# Patient Record
Sex: Male | Born: 1965 | Race: White | Hispanic: No | Marital: Married | Smoking: Never smoker
Health system: Southern US, Community
[De-identification: ages and names within clinical notes are randomized; demographics above are authoritative.]

---

## 2010-01-24 ENCOUNTER — Ambulatory Visit: Payer: Self-pay | Admitting: Diagnostic Radiology

## 2010-01-24 ENCOUNTER — Emergency Department (HOSPITAL_BASED_OUTPATIENT_CLINIC_OR_DEPARTMENT_OTHER): Admission: EM | Admit: 2010-01-24 | Discharge: 2010-01-24 | Payer: Self-pay | Admitting: Emergency Medicine

## 2011-03-08 LAB — DIFFERENTIAL
Basophils Absolute: 0 10*3/uL (ref 0.0–0.1)
Eosinophils Relative: 0 % (ref 0–5)
Lymphocytes Relative: 22 % (ref 12–46)
Monocytes Relative: 7 % (ref 3–12)
Neutro Abs: 5.3 10*3/uL (ref 1.7–7.7)
Neutrophils Relative %: 71 % (ref 43–77)

## 2011-03-08 LAB — BASIC METABOLIC PANEL
BUN: 13 mg/dL (ref 6–23)
CO2: 28 mEq/L (ref 19–32)
Calcium: 9.2 mg/dL (ref 8.4–10.5)
Creatinine, Ser: 1 mg/dL (ref 0.4–1.5)
GFR calc Af Amer: >60 mL/min (ref >60)
GFR calc non Af Amer: >60 mL/min (ref >60)
Glucose, Bld: 98 mg/dL (ref 70–99)

## 2011-03-08 LAB — CBC
Platelets: 275 10*3/uL (ref 150–400)
RBC: 5.99 MIL/uL — ABNORMAL HIGH (ref 4.22–5.81)
WBC: 7.4 10*3/uL (ref 4.0–10.5)

## 2012-08-25 ENCOUNTER — Ambulatory Visit (HOSPITAL_BASED_OUTPATIENT_CLINIC_OR_DEPARTMENT_OTHER)
Admission: RE | Admit: 2012-08-25 | Discharge: 2012-08-25 | Disposition: A | Discharge status: Home / Self Care | Payer: Self-pay | Source: Ambulatory Visit | Attending: Occupational Medicine | Admitting: Occupational Medicine

## 2012-08-25 ENCOUNTER — Other Ambulatory Visit: Payer: Self-pay | Admitting: Occupational Medicine

## 2012-08-25 DIAGNOSIS — Z0000 Encounter for general adult medical examination without abnormal findings: Secondary | ICD-10-CM

## 2012-10-07 IMAGING — CR DG CHEST 1V
1 series · 1 of 1 positions shown · non-contrast
Comparison: None.

CLINICAL DATA: Annual physical.

CHEST - 1 VIEW

[w chest pa]
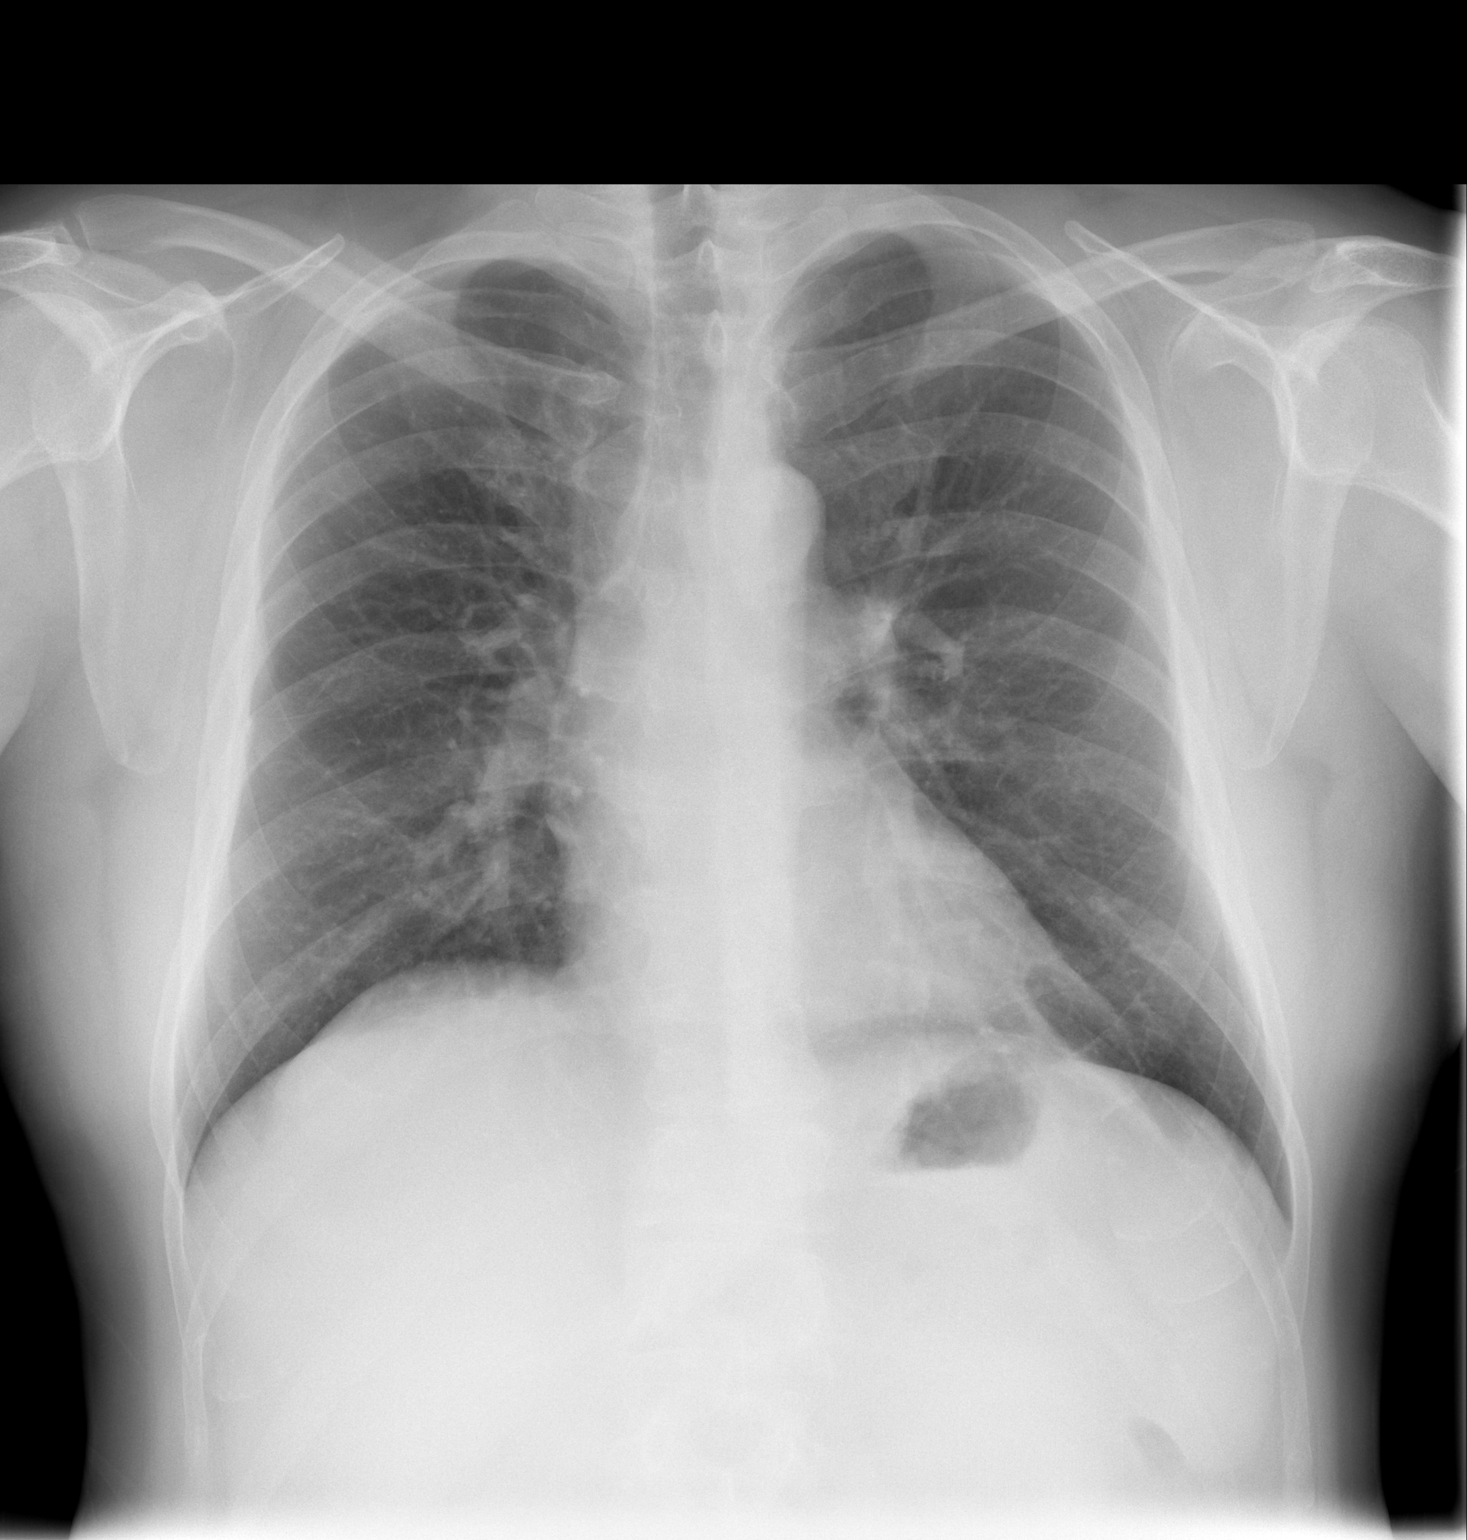

[1 of 1 positions shown; findings below may reference images not displayed]

FINDINGS: The heart size and mediastinal contours are within normal
limits.  Both lungs are clear.
IMPRESSION: No active disease.

## 2023-07-09 ENCOUNTER — Ambulatory Visit
Admission: RE | Admit: 2023-07-09 | Discharge: 2023-07-09 | Disposition: A | Discharge status: Home / Self Care | Payer: Managed Care, Other (non HMO) | Source: Ambulatory Visit | Attending: Physician Assistant | Admitting: Physician Assistant

## 2023-07-09 VITALS — BP 133/86 | HR 101 | Temp 98.9°F | Resp 16

## 2023-07-09 DIAGNOSIS — R6889 Other general symptoms and signs: Secondary | ICD-10-CM

## 2023-07-09 DIAGNOSIS — J029 Acute pharyngitis, unspecified: Secondary | ICD-10-CM

## 2023-07-09 LAB — POCT RAPID STREP A (OFFICE): Rapid Strep A Screen: NEGATIVE

## 2023-07-09 NOTE — Discharge Instructions (Signed)
Warm salt water gargles, Lozenges,  Try using Sudafed and Flonase  for relief of symptoms

## 2023-07-09 NOTE — ED Provider Notes (Signed)
[facility]    CSN: [id number] Arrival date & time: 07/09/23  3244      History   Chief Complaint Chief Complaint  Patient presents with   Sore Throat    Entered by patient    HPI Douglas Howard is a 57 y.o. male.   Patient complains of a sore throat for the past week.  Patient reports symptoms started last Wednesday.  Patient reports his throat has been painful patient reports he has had a lot of congestion and drainage.  Patient denies any fever or chills.  He reports his spouse has also been sick.  Patient has not taken a COVID test.  Patient is worried that he could have strep throat.  Patient also reports he feels like he is losing his voice  The history is provided by the patient. No language interpreter was used.  Sore Throat This is a new problem. The problem occurs constantly. The problem has been gradually worsening. Pertinent negatives include no chest pain, no abdominal pain and no shortness of breath. Nothing aggravates the symptoms. Nothing relieves the symptoms. He has tried nothing for the symptoms.    History reviewed. No pertinent past medical history.  There are no problems to display for this patient.   History reviewed. No pertinent surgical history.     Home Medications    Prior to Admission medications   Not on File    Family History History reviewed. No pertinent family history.  Social History Social History   Tobacco Use   Smoking status: Never   Smokeless tobacco: Never  Substance Use Topics   Alcohol use: Not Currently   Drug use: Never     Allergies   Patient has no known allergies.   Review of Systems Review of Systems  Respiratory:  Negative for shortness of breath.   Cardiovascular:  Negative for chest pain.  Gastrointestinal:  Negative for abdominal pain.  All other systems reviewed and are negative.    Physical Exam Triage Vital Signs ED Triage Vitals  Encounter Vitals Group     BP 07/09/23 0838  133/86     Systolic BP Percentile --      Diastolic BP Percentile --      Pulse Rate 07/09/23 0838 (!) 101     Resp 07/09/23 0838 16     Temp 07/09/23 0838 98.9 F (37.2 C)     Temp Source 07/09/23 0838 Oral     SpO2 07/09/23 0838 95 %     Weight --      Height --      Head Circumference --      Peak Flow --      Pain Score 07/09/23 0837 6     Pain Loc --      Pain Education --      Exclude from Growth Chart --    No data found.  Updated Vital Signs BP 133/86 (BP Location: Right Arm)   Pulse (!) 101   Temp 98.9 F (37.2 C) (Oral)   Resp 16   SpO2 95%   Visual Acuity Right Eye Distance:   Left Eye Distance:   Bilateral Distance:    Right Eye Near:   Left Eye Near:    Bilateral Near:     Physical Exam Vitals and nursing note reviewed.  Constitutional:      Appearance: He is well-developed.  HENT:     Head: Normocephalic.     Mouth/Throat:  Mouth: Mucous membranes are moist.     Pharynx: Pharyngeal swelling and posterior oropharyngeal erythema present.  Eyes:     Conjunctiva/sclera: Conjunctivae normal.  Cardiovascular:     Rate and Rhythm: Normal rate.  Pulmonary:     Effort: Pulmonary effort is normal.  Abdominal:     General: There is no distension.  Musculoskeletal:        General: Normal range of motion.     Cervical back: Normal range of motion.  Skin:    General: Skin is warm.  Neurological:     General: No focal deficit present.     Mental Status: He is alert and oriented to person, place, and time.      UC Treatments / Results  Labs (all labs ordered are listed, but only abnormal results are displayed) Labs Reviewed  POCT RAPID STREP A (OFFICE)    EKG   Radiology No results found.  Procedures Procedures (including critical care time)  Medications Ordered in UC Medications - No data to display  Initial Impression / Assessment and Plan / UC Course  I have reviewed the triage vital signs and the nursing notes.  Pertinent  labs & imaging results that were available during my care of the patient were reviewed by me and considered in my medical decision making (see chart for details).     Strep screen is negative.  I suspect patient has a viral illness.  No evidence of bacterial tonsillitis.  Patient is counseled on symptomatic care.  Patient advised to recheck with primary care physician if symptoms persist Final Clinical Impressions(s) / UC Diagnoses   Final diagnoses:  Acute pharyngitis, unspecified etiology  Congestion of throat     Discharge Instructions      Warm salt water gargles, Lozenges,  Try using Sudafed and Flonase  for relief of symptoms    ED Prescriptions   None    PDMP not reviewed this encounter. An After Visit Summary was printed and given to the patient.       Elson Areas, [place] 07/09/23 1001

## 2023-07-09 NOTE — ED Triage Notes (Signed)
Pt presents with c/o scratchy throat x 1 week. States it has gotten worse and has taken cough syrups and decongestants. States it has helped and has not had a fever.

## 2024-08-03 ENCOUNTER — Emergency Department (HOSPITAL_BASED_OUTPATIENT_CLINIC_OR_DEPARTMENT_OTHER): Admission: EM | Admit: 2024-08-03 | Discharge: 2024-08-03 | Disposition: A | Discharge status: Home / Self Care

## 2024-08-03 ENCOUNTER — Other Ambulatory Visit: Payer: Self-pay

## 2024-08-03 ENCOUNTER — Emergency Department (HOSPITAL_COMMUNITY)

## 2024-08-03 ENCOUNTER — Emergency Department (HOSPITAL_BASED_OUTPATIENT_CLINIC_OR_DEPARTMENT_OTHER)

## 2024-08-03 ENCOUNTER — Encounter (HOSPITAL_BASED_OUTPATIENT_CLINIC_OR_DEPARTMENT_OTHER): Payer: Self-pay

## 2024-08-03 DIAGNOSIS — I10 Essential (primary) hypertension: Secondary | ICD-10-CM | POA: Diagnosis not present

## 2024-08-03 DIAGNOSIS — R42 Dizziness and giddiness: Secondary | ICD-10-CM | POA: Insufficient documentation

## 2024-08-03 DIAGNOSIS — Z79899 Other long term (current) drug therapy: Secondary | ICD-10-CM | POA: Insufficient documentation

## 2024-08-03 LAB — TROPONIN T, HIGH SENSITIVITY: Troponin T High Sensitivity: <15 ng/L (ref 0–19)

## 2024-08-03 LAB — BASIC METABOLIC PANEL WITH GFR
Anion gap: 14 (ref 5–15)
BUN: 9 mg/dL (ref 6–20)
CO2: 24 mmol/L (ref 22–32)
Calcium: 9 mg/dL (ref 8.9–10.3)
Chloride: 103 mmol/L (ref 98–111)
Creatinine, Ser: 0.92 mg/dL (ref 0.61–1.24)
GFR, Estimated: >60 mL/min (ref >60)
Glucose, Bld: 91 mg/dL (ref 70–99)
Potassium: 3.7 mmol/L (ref 3.5–5.1)
Sodium: 140 mmol/L (ref 135–145)

## 2024-08-03 LAB — CBC
HCT: 51.2 % (ref 39.0–52.0)
Hemoglobin: 17.1 g/dL — ABNORMAL HIGH (ref 13.0–17.0)
MCH: 28.9 pg (ref 26.0–34.0)
MCHC: 33.4 g/dL (ref 30.0–36.0)
MCV: 86.6 fL (ref 80.0–100.0)
Platelets: 269 K/uL (ref 150–400)
RBC: 5.91 MIL/uL — ABNORMAL HIGH (ref 4.22–5.81)
RDW: 12.3 % (ref 11.5–15.5)
WBC: 8.6 K/uL (ref 4.0–10.5)
nRBC: 0 % (ref 0.0–0.2)

## 2024-08-03 MED ORDER — MECLIZINE HCL 50 MG PO TABS: 50.0000 mg | ORAL_TABLET | Freq: Three times a day (TID) | ORAL | 0 refills | Status: AC | PRN

## 2024-08-03 MED ORDER — LACTATED RINGERS IV BOLUS
1000.0000 mL | Freq: Once | INTRAVENOUS | Status: AC
  Administered 2024-08-03: 1000 mL via INTRAVENOUS

## 2024-08-03 MED ORDER — AMLODIPINE BESYLATE 5 MG PO TABS: 5.0000 mg | ORAL_TABLET | Freq: Every day | ORAL | 0 refills | Status: AC

## 2024-08-03 MED ORDER — MECLIZINE HCL 25 MG PO TABS
50.0000 mg | ORAL_TABLET | Freq: Once | ORAL | Status: AC
  Administered 2024-08-03: 50 mg via ORAL
  Filled 2024-08-03: qty 2

## 2024-08-03 NOTE — Discharge Instructions (Signed)
 If your blood pressure remains elevated please start the amlodipine  and follow-up with your doctor regardless to have your blood pressure rechecked as an outpatient.  Please take the meclizine  as needed for dizziness.  Return to the ER for worsening symptoms.

## 2024-08-03 NOTE — ED Notes (Signed)
Pt is in MRI  

## 2024-08-03 NOTE — ED Provider Notes (Signed)
 [facility] Provider Note   CSN: [id number] Arrival date & time: 08/03/24  9065     Patient presents with: Hypertension   Douglas Howard is a 58 y.o. male.   58 year old male with no reported past medical history presenting to the emergency department today with dizziness.  The patient states that he woke up yesterday morning with some dizziness.  Reports this was intermittent throughout the day yesterday.  Reports that it was worse last night.  He checked his blood pressure at home and it was elevated which is out of the ordinary for him.  He denies any known history of hypertension.  Denies any chest pain or shortness of breath.  Denies any headache.  He states that the dizziness was persistent this morning so he came to the emergency department for further evaluation.   Hypertension       Prior to Admission medications   Medication Sig Start Date End Date Taking? Authorizing Provider  amLODipine  (NORVASC ) 5 MG tablet Take 1 tablet (5 mg total) by mouth daily. 08/03/24  Yes Ula Prentice SAUNDERS, MD  meclizine  (ANTIVERT ) 50 MG tablet Take 1 tablet (50 mg total) by mouth 3 (three) times daily as needed. 08/03/24  Yes Ula Prentice SAUNDERS, MD    Allergies: Patient has no known allergies.    Review of Systems  Neurological:  Positive for dizziness.  All other systems reviewed and are negative.   Updated Vital Signs BP (!) 172/98   Pulse 73   Temp 97.9 F (36.6 C) (Oral)   Resp 16   Ht 5' 8 (1.727 m)   Wt 90.7 kg   SpO2 97%   BMI 30.41 kg/m   Physical Exam Vitals and nursing note reviewed.   Gen: NAD Eyes: PERRL, EOMI HEENT: no oropharyngeal swelling Neck: trachea midline Resp: clear to auscultation bilaterally Card: RRR, no murmurs, rubs, or gallops Abd: nontender, nondistended Extremities: no calf tenderness, no edema Vascular: 2+ radial pulses bilaterally, 2+ DP pulses bilaterally Neuro: Cranial nerves intact, equal strength and  sensation throughout bilateral upper and lower extremities no obvious dysmetria on finger-to-nose testing although patient is little slower in the left than the right, no obvious unilateral nystagmus noted, no vertical nystagmus Skin: no rashes Psyc: acting appropriately   (all labs ordered are listed, but only abnormal results are displayed) Labs Reviewed  CBC - Abnormal; Notable for the following components:      Result Value   RBC 5.91 (*)    Hemoglobin 17.1 (*)    All other components within normal limits  BASIC METABOLIC PANEL WITH GFR  TROPONIN T, HIGH SENSITIVITY    EKG: EKG Interpretation Date/Time:  Monday August 03 2024 10:04:49 EDT Ventricular Rate:  87 PR Interval:  136 QRS Duration:  95 QT Interval:  356 QTC Calculation: 429 R Axis:   -18  Text Interpretation: Sinus rhythm Borderline left axis deviation Abnormal R-wave progression, early transition Borderline T abnormalities, inferior leads Confirmed by Ula Prentice [phone]) on 08/03/2024 10:06:10 AM  Radiology: CT Head Wo Contrast Result Date: 08/03/2024 CLINICAL DATA:  58 year old male with neurologic Deficit. Dizziness, elevated blood pressure. EXAM: CT HEAD WITHOUT CONTRAST TECHNIQUE: Contiguous axial images were obtained from the base of the skull through the vertex without intravenous contrast. RADIATION DOSE REDUCTION: This exam was performed according to the departmental dose-optimization program which includes automated exposure control, adjustment of the mA and/or kV according to patient size and/or use of iterative reconstruction technique. COMPARISON:  Paranasal sinus CT 01/24/2010. FINDINGS: Brain: Cerebral volume is within normal limits. No midline shift, ventriculomegaly, mass effect, evidence of mass lesion, intracranial hemorrhage or evidence of cortically based acute infarction. Lean-white matter differentiation is within normal limits throughout the brain. Vascular: Calcified atherosclerosis at the skull  base. No suspicious intracranial vascular hyperdensity. Skull: Intact.  No acute osseous abnormality identified. Sinuses/Orbits: Tympanic cavities, Visualized paranasal sinuses and mastoids are clear. Other: Incidental vertex scalp sebaceous type cysts. No gaze deviation. Postoperative changes to both globes. IMPRESSION: Normal for age noncontrast Head CT. Electronically Signed   By: VEAR Hurst M.D.   On: 08/03/2024 12:07     Procedures   Medications Ordered in the ED  lactated ringers  bolus 1,000 mL (0 mLs Intravenous Stopped 08/03/24 1314)  meclizine  (ANTIVERT ) tablet 50 mg (50 mg Oral Given 08/03/24 1149)                                    Medical Decision Making 58 year old male with no reported past medical history presenting to the emergency department today with lightheadedness and dizziness starting yesterday morning.  I will further evaluate patient here with basic labs as well as an EKG and troponin to evaluate for endorgan dysfunction given the elevated blood pressure.  Will also obtain a CT scan of his head to eval for intracranial hemorrhage or mass lesion as the patient has never had symptoms consistent with this in the past.  Will give the patient meclizine  as well as a fluid bolus here to see if this helps his symptoms.  Will reevaluate.  If the patient's symptoms resolved with treatment for peripheral vertigo I think they could potentially be discharged.  If he is still having symptoms he may require transfer for MRI to rule out stroke although his neurologic exam here today is relatively reassuring.  The patient's initial labs and CT scan are unremarkable.  He did remain symptomatic after the medications although symptoms did improve some.  After a long conversation with the patient who like to go over to [facility] for MRI to rule out CVA I think is reasonable.  Dr. Zavitz accepts the patient for ED to ED transfer for MRI.  Amount and/or Complexity of Data Reviewed Labs:  ordered. Radiology: ordered.  Risk Prescription drug management.        Final diagnoses:  Dizziness    ED Discharge Orders          Ordered    meclizine  (ANTIVERT ) 50 MG tablet  3 times daily PRN        08/03/24 1342    amLODipine  (NORVASC ) 5 MG tablet  Daily        08/03/24 1342               Ula Prentice SAUNDERS, MD 08/03/24 1421

## 2024-08-03 NOTE — ED Triage Notes (Signed)
 Pt states that he started having dizziness and lightheadedness starting yesterday am. Symptoms worsened and pt took bp last night and it was about 160/100. Pt has no hx of HTN, not on bp meds.

## 2024-08-03 NOTE — ED Triage Notes (Signed)
 Patient was sent from [facility] for a MRI.

## 2024-08-03 NOTE — ED Provider Notes (Signed)
 Patient presented to the emergency room with complaints of dizziness.  Patient was seen by Dr. ONEIDA at [facility].  Patient's was sent to this ED did have MRI to rule out stroke.  Patient's MRI does not show any acute abnormalities.  I have discussed the findings with the patient.  We discussed outpatient follow-up with PCP regarding his blood pressure.  Patient was given a prescription for amlodipine  as well as meclizine  by Dr. Ula.  Evaluation and diagnostic testing in the emergency department does not suggest an emergent condition requiring admission or immediate intervention beyond what has been performed at this time.  The patient is safe for discharge and has been instructed to return immediately for worsening symptoms, change in symptoms or any other concerns.    Randol Simmonds, MD 08/03/24 [phone]

## 2024-08-03 NOTE — ED Notes (Signed)
 Pt sent POV to [facility] for MRI. Pt instructed to go straight to [facility] ED for check in and stay NPO until MRI. IV d/c'ed. [facility] ED Charge RN aware of pt coming.

## 2024-08-03 NOTE — ED Notes (Signed)
 Charge nurse is aware of pending POV arrival to [facility] for MRI

## 2024-08-03 NOTE — ED Notes (Signed)
 Patient transported to CT
